# Patient Record
Sex: Female | Born: 1965 | Race: White | Hispanic: No | Smoking: Never smoker
Health system: Southern US, Community
[De-identification: ages and names within clinical notes are randomized; demographics above are authoritative.]

## PROBLEM LIST (undated history)

## (undated) DIAGNOSIS — N200 Calculus of kidney: Secondary | ICD-10-CM

## (undated) DIAGNOSIS — R002 Palpitations: Secondary | ICD-10-CM

## (undated) DIAGNOSIS — I10 Essential (primary) hypertension: Secondary | ICD-10-CM

## (undated) DIAGNOSIS — Q208 Other congenital malformations of cardiac chambers and connections: Secondary | ICD-10-CM

## (undated) DIAGNOSIS — E669 Obesity, unspecified: Secondary | ICD-10-CM

## (undated) DIAGNOSIS — R42 Dizziness and giddiness: Secondary | ICD-10-CM

## (undated) DIAGNOSIS — Q893 Situs inversus: Secondary | ICD-10-CM

## (undated) DIAGNOSIS — R011 Cardiac murmur, unspecified: Secondary | ICD-10-CM

## (undated) DIAGNOSIS — K589 Irritable bowel syndrome without diarrhea: Secondary | ICD-10-CM

## (undated) DIAGNOSIS — I071 Rheumatic tricuspid insufficiency: Secondary | ICD-10-CM

## (undated) HISTORY — DX: Other congenital malformations of cardiac chambers and connections: Q20.8

## (undated) HISTORY — DX: Irritable bowel syndrome without diarrhea: K58.9

## (undated) HISTORY — PX: CHOLECYSTECTOMY: SHX55

## (undated) HISTORY — DX: Palpitations: R00.2

## (undated) HISTORY — DX: Dizziness and giddiness: R42

## (undated) HISTORY — DX: Situs inversus: Q89.3

## (undated) HISTORY — PX: KIDNEY STONE SURGERY: SHX686

## (undated) HISTORY — DX: Calculus of kidney: N20.0

## (undated) HISTORY — DX: Obesity, unspecified: E66.9

## (undated) HISTORY — DX: Essential (primary) hypertension: I10

## (undated) HISTORY — DX: Rheumatic tricuspid insufficiency: I07.1

## (undated) HISTORY — DX: Cardiac murmur, unspecified: R01.1

---

## 1998-09-11 ENCOUNTER — Ambulatory Visit (HOSPITAL_COMMUNITY): Admission: RE | Admit: 1998-09-11 | Discharge: 1998-09-11 | Payer: Self-pay | Admitting: Family Medicine

## 2000-06-02 ENCOUNTER — Encounter: Payer: Self-pay | Admitting: Family Medicine

## 2000-06-02 ENCOUNTER — Ambulatory Visit (HOSPITAL_COMMUNITY): Admission: RE | Admit: 2000-06-02 | Discharge: 2000-06-02 | Payer: Self-pay | Admitting: Family Medicine

## 2000-07-29 ENCOUNTER — Encounter: Payer: Self-pay | Admitting: Family Medicine

## 2000-07-29 ENCOUNTER — Ambulatory Visit (HOSPITAL_COMMUNITY): Admission: RE | Admit: 2000-07-29 | Discharge: 2000-07-29 | Payer: Self-pay | Admitting: Family Medicine

## 2000-09-16 ENCOUNTER — Encounter: Payer: Self-pay | Admitting: Family Medicine

## 2000-09-16 ENCOUNTER — Ambulatory Visit (HOSPITAL_COMMUNITY): Admission: RE | Admit: 2000-09-16 | Discharge: 2000-09-16 | Payer: Self-pay | Admitting: Family Medicine

## 2000-11-09 ENCOUNTER — Encounter: Payer: Self-pay | Admitting: Emergency Medicine

## 2000-11-09 ENCOUNTER — Emergency Department (HOSPITAL_COMMUNITY): Admission: EM | Admit: 2000-11-09 | Discharge: 2000-11-09 | Payer: Self-pay | Admitting: Emergency Medicine

## 2000-12-04 ENCOUNTER — Emergency Department (HOSPITAL_COMMUNITY): Admission: EM | Admit: 2000-12-04 | Discharge: 2000-12-04 | Payer: Self-pay | Admitting: Emergency Medicine

## 2001-09-10 ENCOUNTER — Ambulatory Visit (HOSPITAL_COMMUNITY): Admission: RE | Admit: 2001-09-10 | Discharge: 2001-09-10 | Payer: Self-pay | Admitting: Family Medicine

## 2001-09-10 ENCOUNTER — Encounter: Payer: Self-pay | Admitting: Family Medicine

## 2001-11-21 ENCOUNTER — Encounter: Payer: Self-pay | Admitting: Emergency Medicine

## 2001-11-21 ENCOUNTER — Emergency Department (HOSPITAL_COMMUNITY): Admission: EM | Admit: 2001-11-21 | Discharge: 2001-11-21 | Payer: Self-pay | Admitting: Emergency Medicine

## 2003-05-28 ENCOUNTER — Encounter: Payer: Self-pay | Admitting: Emergency Medicine

## 2003-05-28 ENCOUNTER — Emergency Department (HOSPITAL_COMMUNITY): Admission: EM | Admit: 2003-05-28 | Discharge: 2003-05-28 | Payer: Self-pay | Admitting: Emergency Medicine

## 2003-09-24 ENCOUNTER — Encounter: Payer: Self-pay | Admitting: *Deleted

## 2003-09-24 ENCOUNTER — Emergency Department (HOSPITAL_COMMUNITY): Admission: EM | Admit: 2003-09-24 | Discharge: 2003-09-24 | Payer: Self-pay | Admitting: *Deleted

## 2004-01-05 ENCOUNTER — Other Ambulatory Visit: Admission: RE | Admit: 2004-01-05 | Discharge: 2004-01-05 | Payer: Self-pay | Admitting: Family Medicine

## 2004-02-09 ENCOUNTER — Encounter: Admission: RE | Admit: 2004-02-09 | Discharge: 2004-02-09 | Payer: Self-pay | Admitting: Gastroenterology

## 2004-09-17 ENCOUNTER — Ambulatory Visit (HOSPITAL_COMMUNITY): Admission: RE | Admit: 2004-09-17 | Discharge: 2004-09-17 | Payer: Self-pay | Admitting: Family Medicine

## 2005-02-10 ENCOUNTER — Other Ambulatory Visit: Admission: RE | Admit: 2005-02-10 | Discharge: 2005-02-10 | Payer: Self-pay | Admitting: Family Medicine

## 2005-11-10 ENCOUNTER — Emergency Department (HOSPITAL_COMMUNITY): Admission: EM | Admit: 2005-11-10 | Discharge: 2005-11-10 | Payer: Self-pay | Admitting: Emergency Medicine

## 2006-07-22 ENCOUNTER — Other Ambulatory Visit: Admission: RE | Admit: 2006-07-22 | Discharge: 2006-07-22 | Payer: Self-pay | Admitting: Family Medicine

## 2007-07-29 ENCOUNTER — Encounter: Admission: RE | Admit: 2007-07-29 | Discharge: 2007-07-29 | Payer: Self-pay | Admitting: Neurology

## 2007-08-05 ENCOUNTER — Other Ambulatory Visit: Admission: RE | Admit: 2007-08-05 | Discharge: 2007-08-05 | Payer: Self-pay | Admitting: Family Medicine

## 2007-10-14 ENCOUNTER — Encounter: Admission: RE | Admit: 2007-10-14 | Discharge: 2007-10-14 | Payer: Self-pay | Admitting: Family Medicine

## 2008-06-07 ENCOUNTER — Emergency Department (HOSPITAL_COMMUNITY): Admission: EM | Admit: 2008-06-07 | Discharge: 2008-06-07 | Payer: Self-pay | Admitting: Emergency Medicine

## 2008-10-16 ENCOUNTER — Encounter: Admission: RE | Admit: 2008-10-16 | Discharge: 2008-10-16 | Payer: Self-pay | Admitting: Family Medicine

## 2009-03-06 ENCOUNTER — Other Ambulatory Visit: Admission: RE | Admit: 2009-03-06 | Discharge: 2009-03-06 | Payer: Self-pay | Admitting: Family Medicine

## 2009-10-23 ENCOUNTER — Encounter: Admission: RE | Admit: 2009-10-23 | Discharge: 2009-10-23 | Payer: Self-pay | Admitting: Family Medicine

## 2010-10-14 ENCOUNTER — Other Ambulatory Visit: Admission: RE | Admit: 2010-10-14 | Discharge: 2010-10-14 | Payer: Self-pay | Admitting: Obstetrics and Gynecology

## 2010-10-29 ENCOUNTER — Encounter: Admission: RE | Admit: 2010-10-29 | Discharge: 2010-10-29 | Payer: Self-pay | Admitting: Family Medicine

## 2010-11-03 ENCOUNTER — Emergency Department (HOSPITAL_COMMUNITY): Admission: EM | Admit: 2010-11-03 | Discharge: 2010-11-03 | Payer: Self-pay | Admitting: Emergency Medicine

## 2011-03-11 LAB — WET PREP, GENITAL
Trich, Wet Prep: NONE SEEN
Yeast Wet Prep HPF POC: NONE SEEN

## 2011-03-11 LAB — URINALYSIS, ROUTINE W REFLEX MICROSCOPIC
Bilirubin Urine: NEGATIVE
Glucose, UA: NEGATIVE mg/dL
Hgb urine dipstick: NEGATIVE
Ketones, ur: NEGATIVE mg/dL
Nitrite: NEGATIVE
Protein, ur: NEGATIVE mg/dL
Specific Gravity, Urine: 1.014 (ref 1.005–1.030)
Urobilinogen, UA: 0.2 mg/dL (ref 0.0–1.0)
pH: 7.5 (ref 5.0–8.0)

## 2011-03-11 LAB — GC/CHLAMYDIA PROBE AMP, GENITAL
Chlamydia, DNA Probe: NEGATIVE
GC Probe Amp, Genital: NEGATIVE

## 2011-03-11 LAB — POCT PREGNANCY, URINE: Preg Test, Ur: NEGATIVE

## 2011-04-07 ENCOUNTER — Emergency Department (HOSPITAL_COMMUNITY)
Admission: EM | Admit: 2011-04-07 | Discharge: 2011-04-08 | Disposition: A | Discharge status: Home / Self Care | Payer: BC Managed Care – HMO | Attending: Emergency Medicine | Admitting: Emergency Medicine

## 2011-04-07 ENCOUNTER — Emergency Department (HOSPITAL_COMMUNITY): Payer: BC Managed Care – HMO

## 2011-04-07 DIAGNOSIS — I10 Essential (primary) hypertension: Secondary | ICD-10-CM | POA: Insufficient documentation

## 2011-04-07 DIAGNOSIS — M25519 Pain in unspecified shoulder: Secondary | ICD-10-CM | POA: Insufficient documentation

## 2011-04-07 DIAGNOSIS — Z79899 Other long term (current) drug therapy: Secondary | ICD-10-CM | POA: Insufficient documentation

## 2011-04-07 DIAGNOSIS — Z9889 Other specified postprocedural states: Secondary | ICD-10-CM | POA: Insufficient documentation

## 2011-04-07 DIAGNOSIS — M79609 Pain in unspecified limb: Secondary | ICD-10-CM | POA: Insufficient documentation

## 2011-04-21 IMAGING — US US PELVIS COMPLETE MODIFY
1 series · 14 of 25 positions shown · non-contrast
Comparison: None.

CLINICAL DATA: Right pelvic pain

TRANSABDOMINAL AND TRANSVAGINAL ULTRASOUND OF PELVIS
DOPPLER ULTRASOUND OF OVARIES
TECHNIQUE: Both transabdominal and transvaginal ultrasound
examinations of the pelvis were performed including evaluation of
the uterus, ovaries, adnexal regions, and pelvic cul-de-sac.
Transabdominal technique was performed for global imaging of the
pelvis.  Transvaginal technique was performed for detailed
evaluation of the endometrium and/or ovaries.  Color and duplex
Doppler ultrasound was utilized to evaluate blood flow to the
ovaries.

[Series 1: us pelvis complete modify · 0.26mm/px · 14 of 53 slices shown]
[im 1/53]
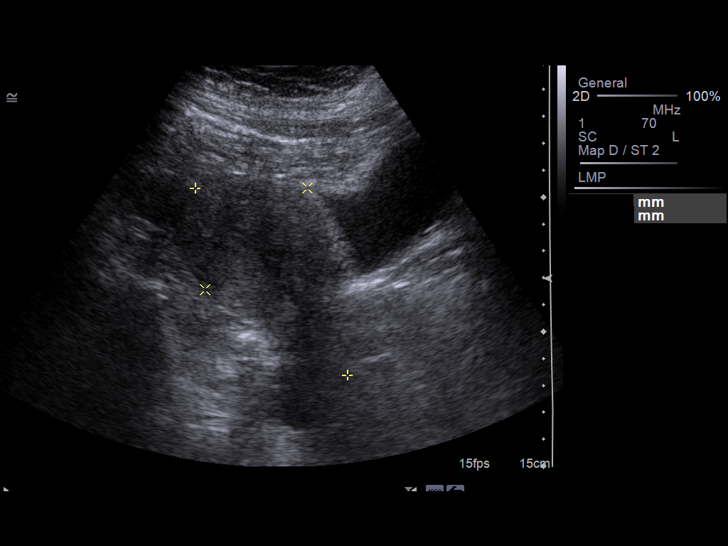
[im 5/53]
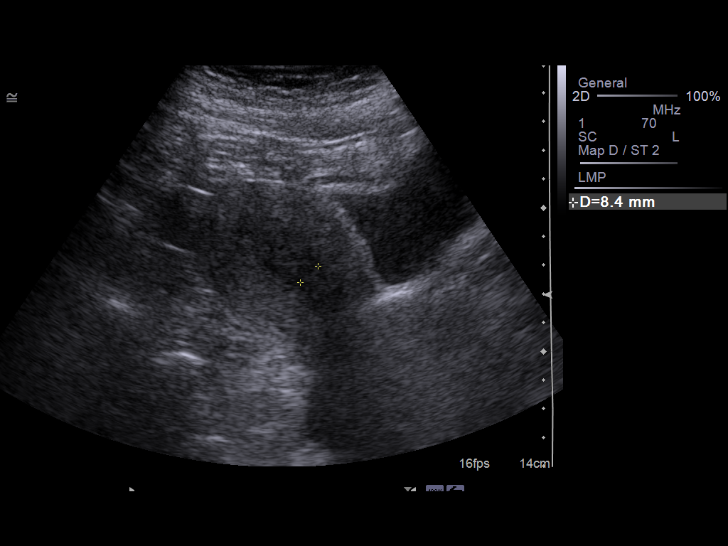
[im 9/53]
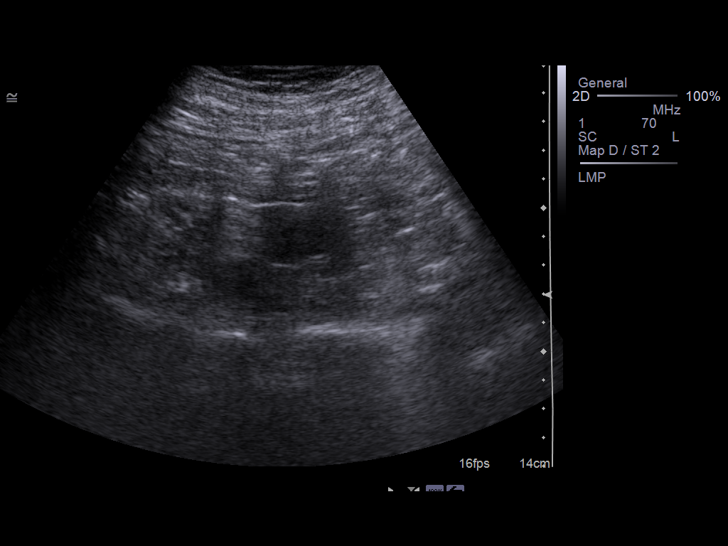
[im 14/53]
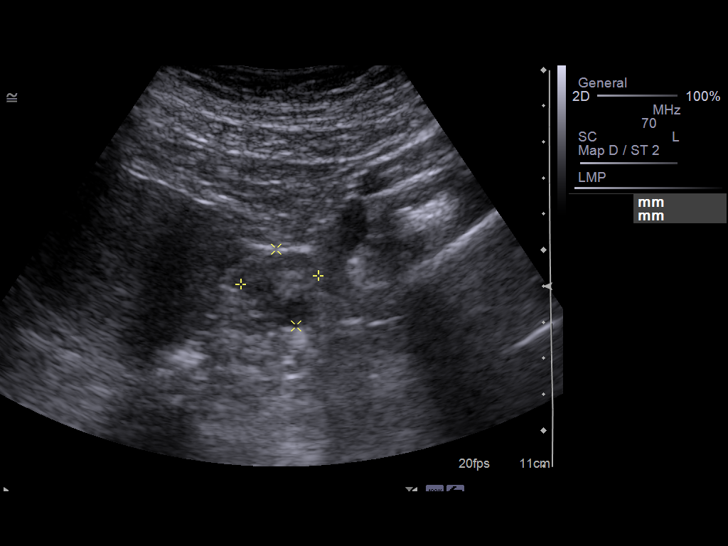
[im 18/53]
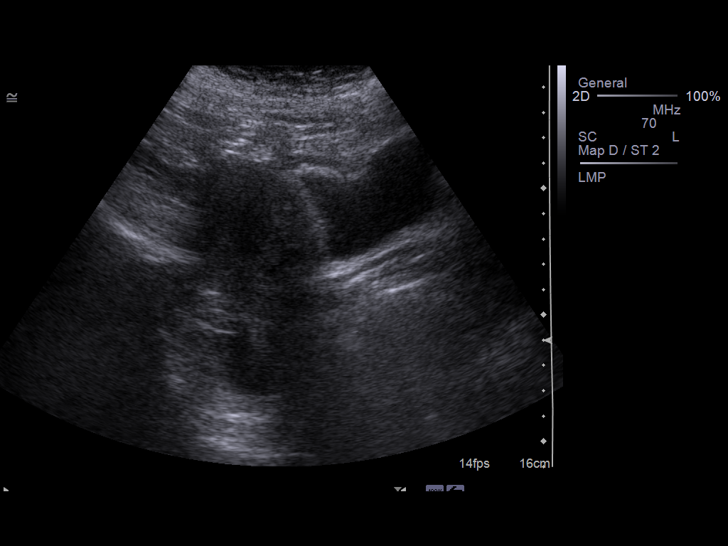
[im 20/53]
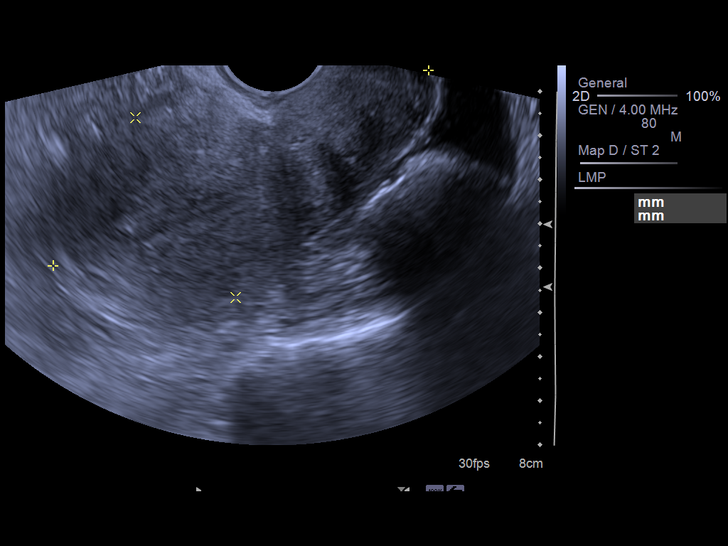
[im 24/53]
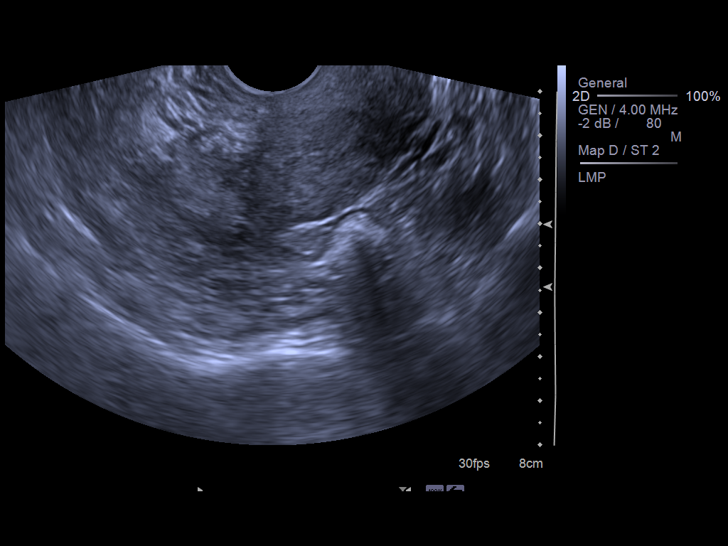
[im 29/53]
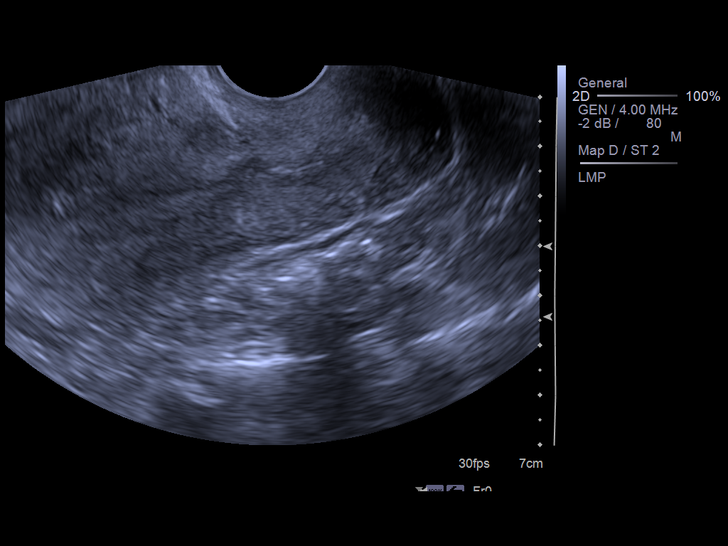
[im 33/53]
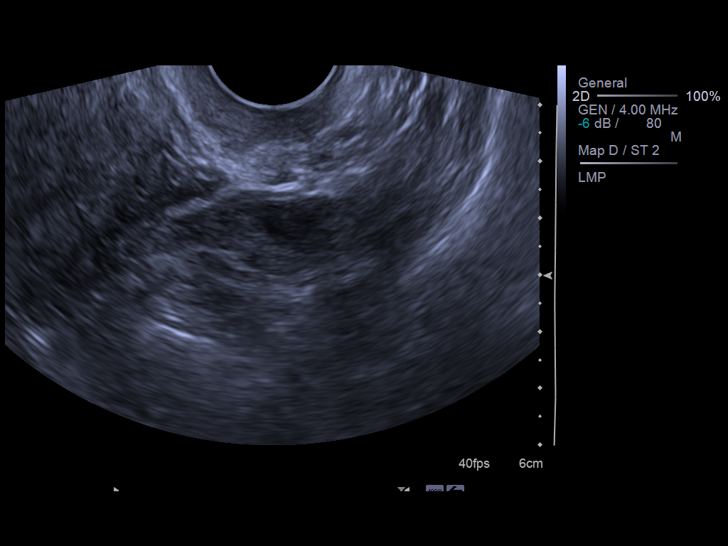
[im 35/53]
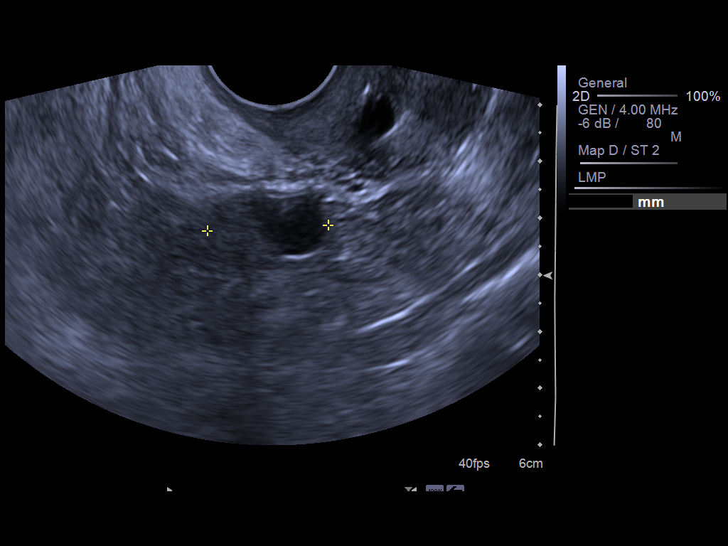
[im 40/53]
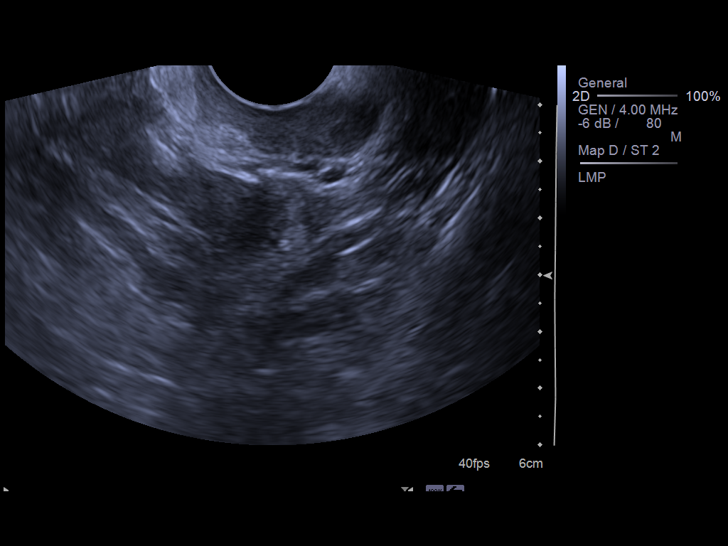
[im 44/53]
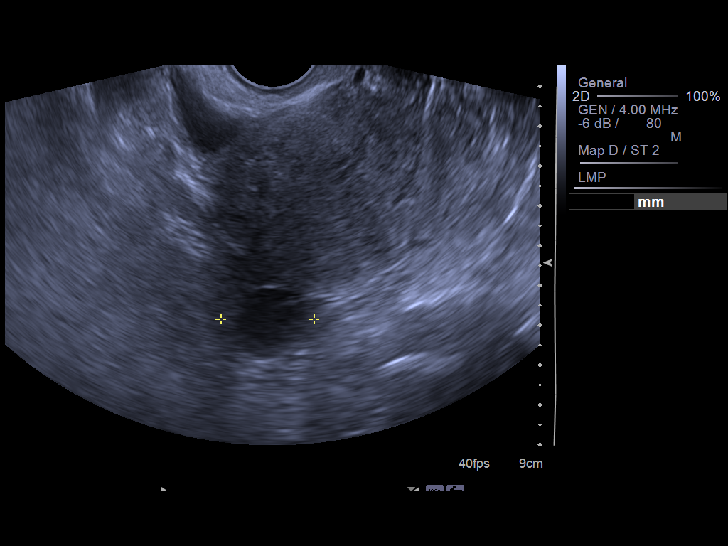
[im 48/53]
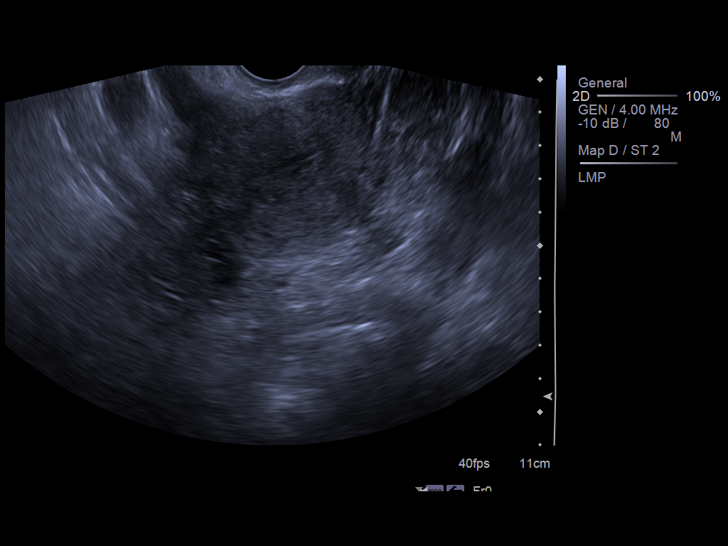
[im 53/53]
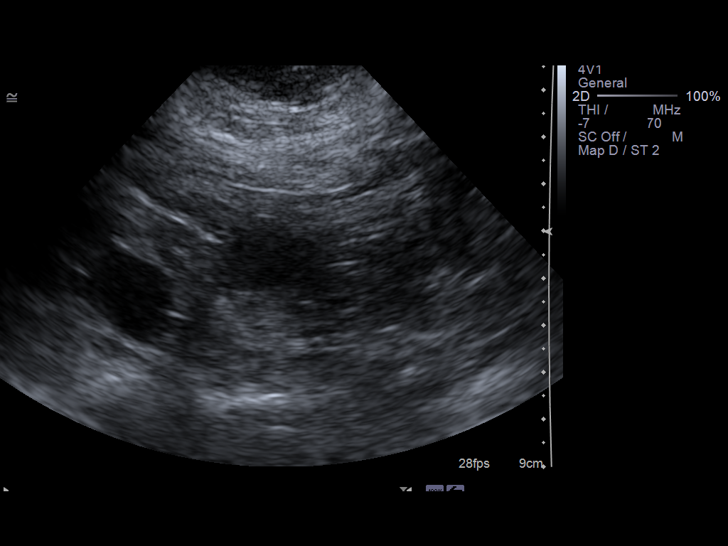

[14 of 25 positions shown; findings below may reference images not displayed]

FINDINGS: Uterus 9.0 x 5.4 x 6.3 cm.  Uniform myometrium.

Endometrium 5 mm in thickness and uniform.

Right Ovary 3.5 x 2.6 x 2.3 cm.  No mass.  Arterial and venous
Doppler flow is documented.

Left Ovary 2.4 x 1.5 x 2.1 cm and within normal limits.  Arterial
and venous Doppler flow is documented.

Other Findings:  Small amount of free fluid.
IMPRESSION: Within normal limits.  Arterial and venous Doppler flow is
documented in both ovaries.

## 2011-05-16 ENCOUNTER — Inpatient Hospital Stay (HOSPITAL_COMMUNITY)
Admission: AD | Admit: 2011-05-16 | Discharge: 2011-05-16 | Disposition: A | Discharge status: Home / Self Care | Payer: BC Managed Care – HMO | Source: Ambulatory Visit | Attending: Obstetrics and Gynecology | Admitting: Obstetrics and Gynecology

## 2011-05-16 DIAGNOSIS — N841 Polyp of cervix uteri: Secondary | ICD-10-CM

## 2011-05-16 DIAGNOSIS — N949 Unspecified condition associated with female genital organs and menstrual cycle: Secondary | ICD-10-CM | POA: Insufficient documentation

## 2011-05-16 DIAGNOSIS — A5901 Trichomonal vulvovaginitis: Secondary | ICD-10-CM

## 2011-05-16 LAB — WET PREP, GENITAL: Yeast Wet Prep HPF POC: NONE SEEN

## 2011-05-17 LAB — GC/CHLAMYDIA PROBE AMP, GENITAL: GC Probe Amp, Genital: NEGATIVE

## 2011-06-16 ENCOUNTER — Other Ambulatory Visit: Payer: Self-pay | Admitting: Obstetrics and Gynecology

## 2011-06-27 ENCOUNTER — Emergency Department: Payer: Self-pay | Admitting: Emergency Medicine

## 2011-10-09 ENCOUNTER — Other Ambulatory Visit: Payer: Self-pay | Admitting: Family Medicine

## 2011-10-09 DIAGNOSIS — Z1231 Encounter for screening mammogram for malignant neoplasm of breast: Secondary | ICD-10-CM

## 2011-10-31 ENCOUNTER — Ambulatory Visit
Admission: RE | Admit: 2011-10-31 | Discharge: 2011-10-31 | Disposition: A | Discharge status: Home / Self Care | Payer: BC Managed Care – HMO | Source: Ambulatory Visit | Attending: Family Medicine | Admitting: Family Medicine

## 2011-10-31 DIAGNOSIS — Z1231 Encounter for screening mammogram for malignant neoplasm of breast: Secondary | ICD-10-CM

## 2011-11-06 ENCOUNTER — Other Ambulatory Visit (HOSPITAL_COMMUNITY)
Admission: RE | Admit: 2011-11-06 | Discharge: 2011-11-06 | Disposition: A | Discharge status: Home / Self Care | Payer: BC Managed Care – HMO | Source: Ambulatory Visit | Attending: Obstetrics and Gynecology | Admitting: Obstetrics and Gynecology

## 2011-11-06 ENCOUNTER — Other Ambulatory Visit: Payer: Self-pay | Admitting: Obstetrics and Gynecology

## 2011-11-06 DIAGNOSIS — Z01419 Encounter for gynecological examination (general) (routine) without abnormal findings: Secondary | ICD-10-CM | POA: Insufficient documentation

## 2012-01-14 ENCOUNTER — Ambulatory Visit: Payer: Self-pay | Admitting: Urology

## 2012-05-23 ENCOUNTER — Other Ambulatory Visit: Payer: Self-pay | Admitting: *Deleted

## 2012-05-23 ENCOUNTER — Encounter: Payer: Self-pay | Admitting: *Deleted

## 2012-06-09 ENCOUNTER — Ambulatory Visit
Admission: RE | Admit: 2012-06-09 | Discharge: 2012-06-09 | Disposition: A | Discharge status: Home / Self Care | Payer: 59 | Source: Ambulatory Visit | Attending: Orthopaedic Surgery | Admitting: Orthopaedic Surgery

## 2012-06-09 ENCOUNTER — Other Ambulatory Visit: Payer: Self-pay | Admitting: Orthopaedic Surgery

## 2012-06-09 DIAGNOSIS — R609 Edema, unspecified: Secondary | ICD-10-CM

## 2012-06-09 DIAGNOSIS — M79605 Pain in left leg: Secondary | ICD-10-CM

## 2012-07-01 IMAGING — CR DG ABDOMEN 1V
1 series · 2 of 2 positions shown · non-contrast
Comparison: none

REASON FOR EXAM: nephrolithiasis
COMMENTS:

PROCEDURE:     DXR - DXR KIDNEY URETER BLADDER  - January 14, 2012 [DATE]
RESULT:     Surgical clips are seen in the left mid upper abdomen. No
nephrolithiasis is demonstrated. Intrauterine device is present. No ureteral
stones are appreciated. The bony structures are unremarkable.

[Series 1: ap · 0.17mm/px · 2 of 2 slices shown]
[im 1/2]
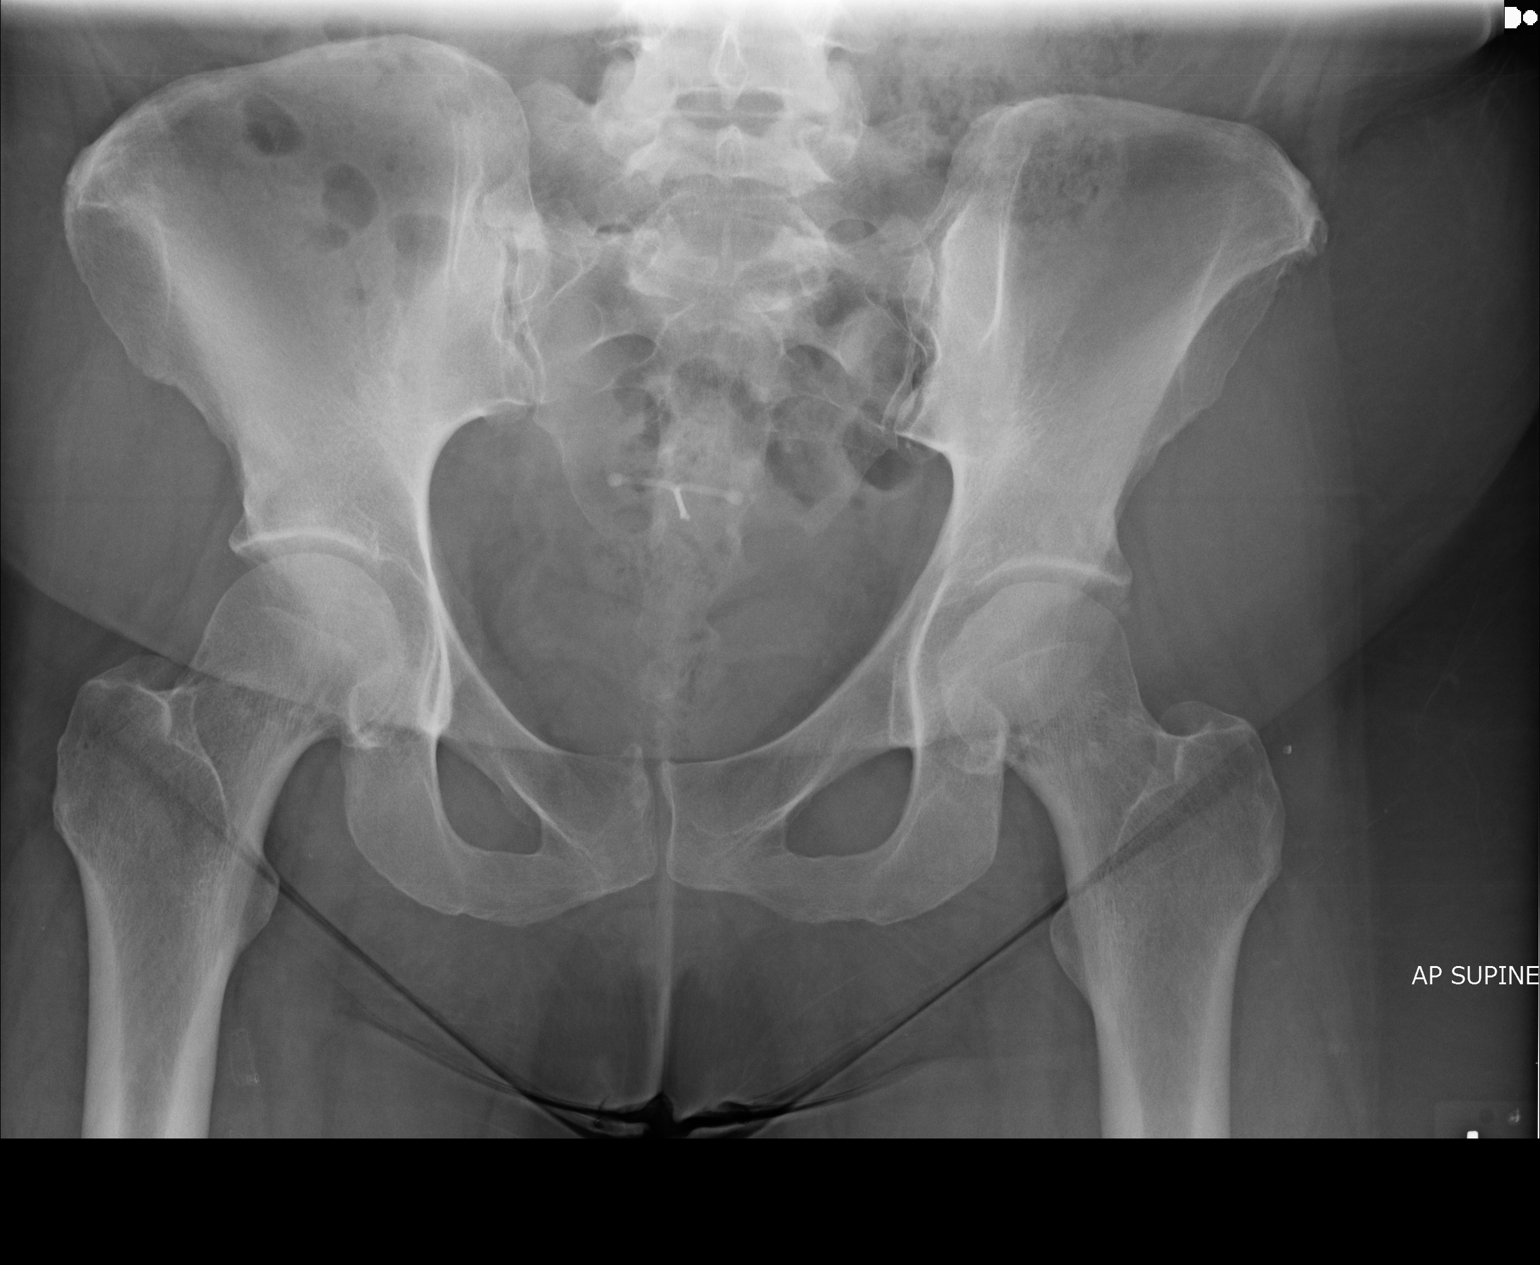
[im 2/2]
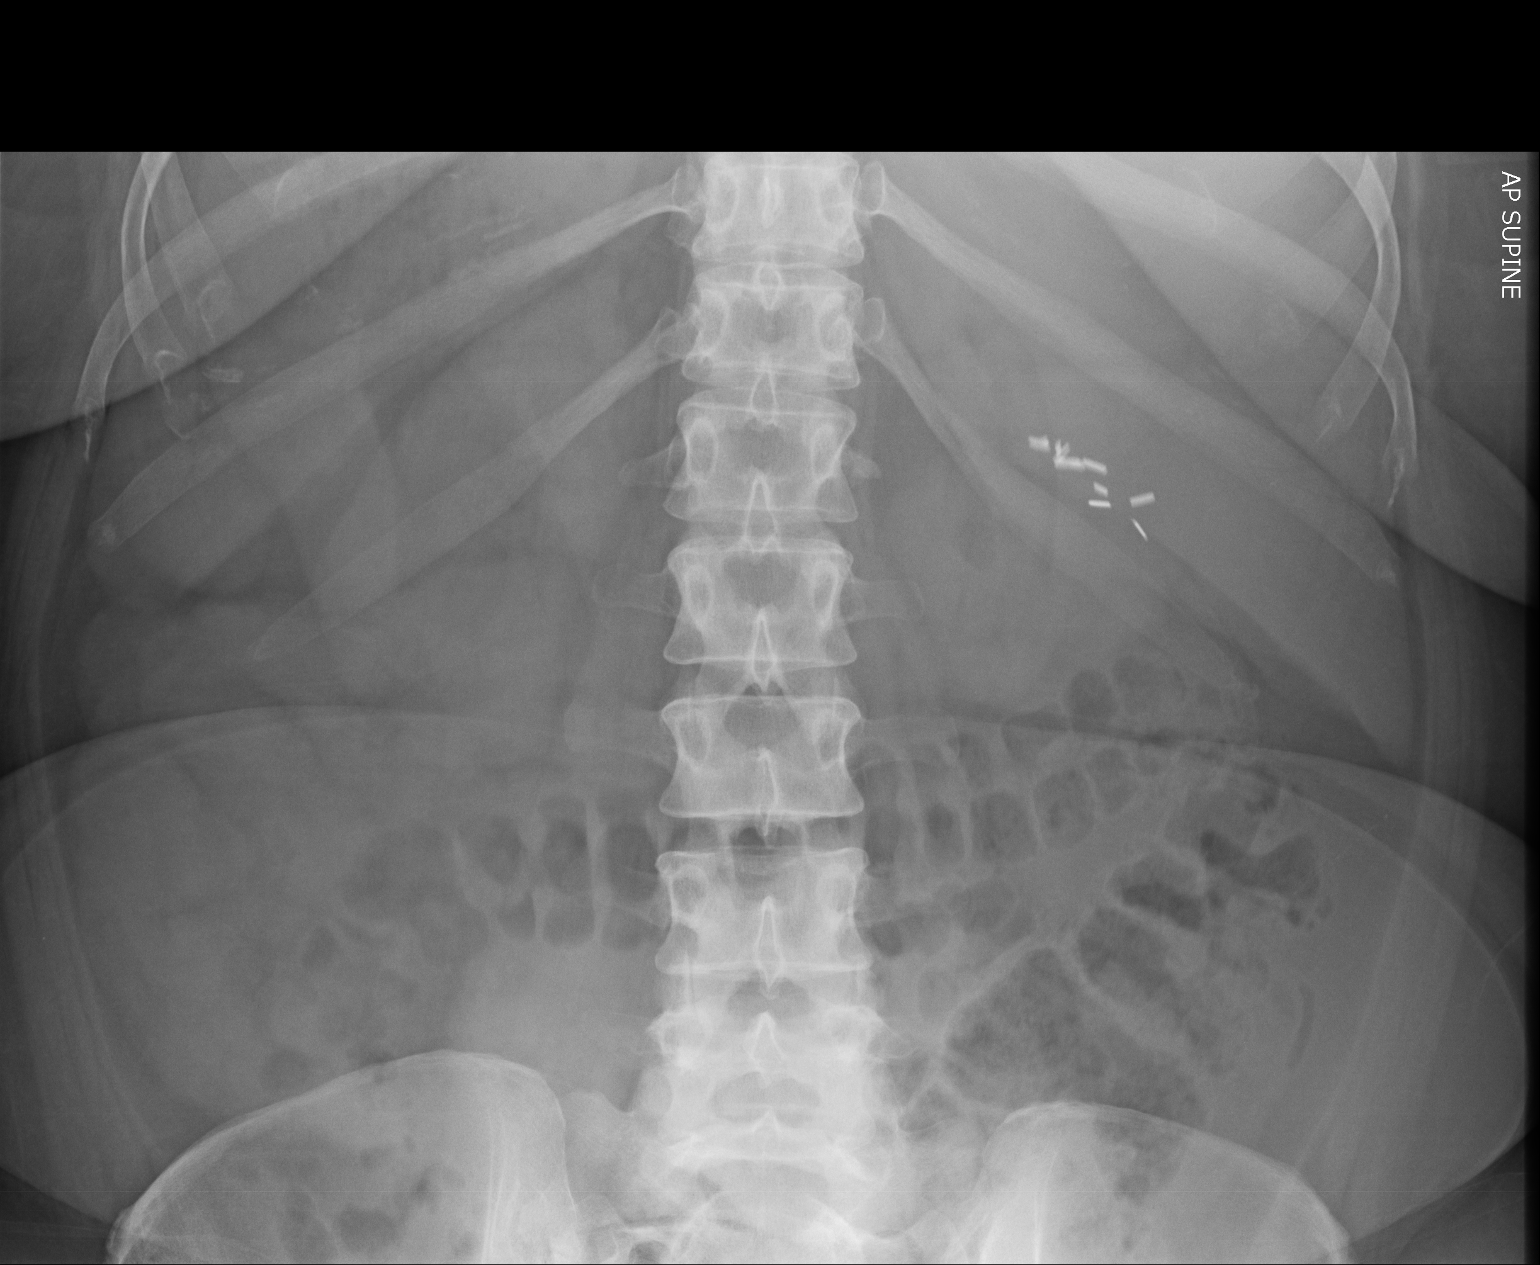

[2 of 2 positions shown; findings below may reference images not displayed]

IMPRESSION: No definite evidence of nephrolithiasis.

## 2012-10-08 ENCOUNTER — Other Ambulatory Visit: Payer: Self-pay | Admitting: Family Medicine

## 2012-10-08 DIAGNOSIS — Z1231 Encounter for screening mammogram for malignant neoplasm of breast: Secondary | ICD-10-CM

## 2012-11-04 ENCOUNTER — Ambulatory Visit
Admission: RE | Admit: 2012-11-04 | Discharge: 2012-11-04 | Disposition: A | Discharge status: Home / Self Care | Payer: 59 | Source: Ambulatory Visit | Attending: Family Medicine | Admitting: Family Medicine

## 2012-11-04 DIAGNOSIS — Z1231 Encounter for screening mammogram for malignant neoplasm of breast: Secondary | ICD-10-CM

## 2012-11-05 ENCOUNTER — Other Ambulatory Visit: Payer: Self-pay | Admitting: Obstetrics and Gynecology

## 2012-11-05 ENCOUNTER — Other Ambulatory Visit (HOSPITAL_COMMUNITY)
Admission: RE | Admit: 2012-11-05 | Discharge: 2012-11-05 | Disposition: A | Discharge status: Home / Self Care | Payer: 59 | Source: Ambulatory Visit | Attending: Obstetrics and Gynecology | Admitting: Obstetrics and Gynecology

## 2012-11-05 DIAGNOSIS — Z01419 Encounter for gynecological examination (general) (routine) without abnormal findings: Secondary | ICD-10-CM | POA: Insufficient documentation

## 2012-11-11 ENCOUNTER — Encounter: Payer: Self-pay | Admitting: Cardiology

## 2013-02-18 ENCOUNTER — Ambulatory Visit: Payer: Self-pay | Admitting: Urology

## 2014-01-02 ENCOUNTER — Other Ambulatory Visit: Payer: Self-pay

## 2014-01-02 DIAGNOSIS — Z1231 Encounter for screening mammogram for malignant neoplasm of breast: Secondary | ICD-10-CM

## 2014-01-20 ENCOUNTER — Emergency Department: Payer: Self-pay | Admitting: Emergency Medicine

## 2014-01-20 LAB — BASIC METABOLIC PANEL
Anion Gap: 4 — ABNORMAL LOW (ref 7–16)
BUN: 11 mg/dL (ref 7–18)
Calcium, Total: 9.2 mg/dL (ref 8.5–10.1)
Chloride: 103 mmol/L (ref 98–107)
Co2: 30 mmol/L (ref 21–32)
Creatinine: 0.59 mg/dL — ABNORMAL LOW (ref 0.60–1.30)
EGFR (African American): >60
EGFR (Non-African Amer.): >60
Glucose: 89 mg/dL (ref 65–99)
OSMOLALITY: 273 (ref 275–301)
POTASSIUM: 4 mmol/L (ref 3.5–5.1)
Sodium: 137 mmol/L (ref 136–145)

## 2014-01-20 LAB — CBC
HCT: 38.8 % (ref 35.0–47.0)
HGB: 13 g/dL (ref 12.0–16.0)
MCH: 30.6 pg (ref 26.0–34.0)
MCHC: 33.4 g/dL (ref 32.0–36.0)
MCV: 92 fL (ref 80–100)
PLATELETS: 246 10*3/uL (ref 150–440)
RBC: 4.23 10*6/uL (ref 3.80–5.20)
RDW: 13.3 % (ref 11.5–14.5)
WBC: 10.7 10*3/uL (ref 3.6–11.0)

## 2014-01-20 LAB — TROPONIN I

## 2014-01-23 ENCOUNTER — Ambulatory Visit: Payer: 59

## 2014-01-30 ENCOUNTER — Ambulatory Visit
Admission: RE | Admit: 2014-01-30 | Discharge: 2014-01-30 | Disposition: A | Discharge status: Home / Self Care | Payer: 59 | Source: Ambulatory Visit

## 2014-01-30 DIAGNOSIS — Z1231 Encounter for screening mammogram for malignant neoplasm of breast: Secondary | ICD-10-CM

## 2014-02-23 ENCOUNTER — Emergency Department: Payer: Self-pay | Admitting: Internal Medicine

## 2014-02-23 LAB — COMPREHENSIVE METABOLIC PANEL
ANION GAP: 7 (ref 7–16)
Albumin: 3.8 g/dL (ref 3.4–5.0)
Alkaline Phosphatase: 122 U/L — ABNORMAL HIGH
BILIRUBIN TOTAL: 1 mg/dL (ref 0.2–1.0)
BUN: 12 mg/dL (ref 7–18)
CHLORIDE: 105 mmol/L (ref 98–107)
CO2: 27 mmol/L (ref 21–32)
Calcium, Total: 8.9 mg/dL (ref 8.5–10.1)
Creatinine: 0.76 mg/dL (ref 0.60–1.30)
EGFR (African American): >60
EGFR (Non-African Amer.): >60
GLUCOSE: 124 mg/dL — AB (ref 65–99)
Osmolality: 279 (ref 275–301)
Potassium: 4 mmol/L (ref 3.5–5.1)
SGOT(AST): 25 U/L (ref 15–37)
SGPT (ALT): 37 U/L (ref 12–78)
Sodium: 139 mmol/L (ref 136–145)
TOTAL PROTEIN: 7.9 g/dL (ref 6.4–8.2)

## 2014-02-23 LAB — CBC WITH DIFFERENTIAL/PLATELET
Basophil #: 0 10*3/uL (ref 0.0–0.1)
Basophil %: 0.4 %
Eosinophil #: 0 10*3/uL (ref 0.0–0.7)
Eosinophil %: 0.2 %
HCT: 42.9 % (ref 35.0–47.0)
HGB: 15.1 g/dL (ref 12.0–16.0)
LYMPHS ABS: 0.3 10*3/uL — AB (ref 1.0–3.6)
LYMPHS PCT: 3.3 %
MCH: 32.2 pg (ref 26.0–34.0)
MCHC: 35.1 g/dL (ref 32.0–36.0)
MCV: 92 fL (ref 80–100)
MONOS PCT: 3 %
Monocyte #: 0.3 x10 3/mm (ref 0.2–0.9)
Neutrophil #: 9.7 10*3/uL — ABNORMAL HIGH (ref 1.4–6.5)
Neutrophil %: 93.1 %
PLATELETS: 216 10*3/uL (ref 150–440)
RBC: 4.68 10*6/uL (ref 3.80–5.20)
RDW: 13 % (ref 11.5–14.5)
WBC: 10.4 10*3/uL (ref 3.6–11.0)

## 2014-02-23 LAB — URINALYSIS, COMPLETE
BACTERIA: NONE SEEN
BLOOD: NEGATIVE
Bilirubin,UR: NEGATIVE
Glucose,UR: NEGATIVE mg/dL (ref 0–75)
NITRITE: NEGATIVE
Ph: 5 (ref 4.5–8.0)
Protein: NEGATIVE
RBC,UR: 2 /HPF (ref 0–5)
SPECIFIC GRAVITY: 1.02 (ref 1.003–1.030)
Squamous Epithelial: 4

## 2014-03-20 ENCOUNTER — Encounter: Payer: Self-pay | Admitting: Cardiovascular Disease

## 2014-03-20 ENCOUNTER — Ambulatory Visit (INDEPENDENT_AMBULATORY_CARE_PROVIDER_SITE_OTHER): Payer: 59 | Admitting: Cardiovascular Disease

## 2014-03-20 ENCOUNTER — Encounter (INDEPENDENT_AMBULATORY_CARE_PROVIDER_SITE_OTHER): Payer: Self-pay

## 2014-03-20 VITALS — BP 155/81 | HR 69 | Ht 69.0 in | Wt 290.0 lb

## 2014-03-20 DIAGNOSIS — R011 Cardiac murmur, unspecified: Secondary | ICD-10-CM | POA: Insufficient documentation

## 2014-03-20 DIAGNOSIS — R079 Chest pain, unspecified: Secondary | ICD-10-CM

## 2014-03-20 DIAGNOSIS — R000 Tachycardia, unspecified: Secondary | ICD-10-CM

## 2014-03-20 NOTE — Progress Notes (Signed)
HPI  This is a pleasant 48 year old female who is here today to establish cardiovascular care. 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 She has not seen a cardiologist in more than 5 years. She saw Dr. [place] in the past in [place]. She moved recently to [facility] and will be establishing with primary care here. She denies any chest pain or dyspnea. She has prolonged history of intermittent palpitations with no documented arrhythmia. These episodes are usually not frequent.  She denies smoking. She uses alcohol occasionally and has no family history of heart disease. 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  Allergies  Allergen Reactions  . Azithromycin      Current Outpatient Prescriptions on File Prior to Visit  Medication Sig Dispense Refill  . DiphenhydrAMINE HCl, Sleep, ([facility] SLEEP AID NIGHTTIME PO) Take 1 tablet by mouth as needed.       No current facility-administered medications on file prior to visit.     Past Medical History  Diagnosis Date  . Tricuspid insufficiency     mild  . Cardiac murmur   . Situs inversus with dextrocardia   . IBS (irritable bowel syndrome)   . Obesity   . Heart palpitations   . Dizziness   . Chest pain   . Hypertension   . Kidney stone   . 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 Cholecystectomy    . Kidney stone surgery      x3  . Cesarean section       Family History  Problem Relation Age of Onset  . Hypertension Mother   . Stroke Mother   . Diabetes Mother   . Kidney failure Mother   . Hyperlipidemia Mother   . Hypertension Brother   . Hyperlipidemia Father   . Hypertension Father      History   Social History  . Marital Status: Divorced    Spouse Name: N/A     Number of Children: N/A  . Years of Education: N/A   Occupational History  . Not on file.   Social History Main Topics  . Smoking status: Never Smoker   . Smokeless tobacco: Never Used  . Alcohol Use: Yes     Comment: occassional  . Drug Use: No  . Sexual Activity: Not on file   Other Topics Concern  . Not on file   Social History Narrative  . No narrative on file     ROS A 10 point review of system was performed. It is negative other than that mentioned in the history of present illness.   PHYSICAL EXAM   BP 155/81  Pulse 69  Ht 5\' 9"  (1.753 m)  Wt 290 lb (131.543 kg)  BMI 42.81 kg/m2 Constitutional: She is oriented to person, place, and time. She appears well-developed and well-nourished. No distress.  HENT: No nasal discharge.  Head: Normocephalic and atraumatic.  Eyes: Pupils are equal and round. No discharge.  Neck: Normal range of motion. Neck supple. No JVD present. No thyromegaly present.  Cardiovascular: Normal rate, regular rhythm, normal heart sounds. Exam reveals no gallop and no friction rub. There is a 1/6 systolic ejection murmur in the aortic area. The heart Apex is on the right side Pulmonary/Chest: Effort normal and breath sounds normal. No  stridor. No respiratory distress. She has no wheezes. She has no rales. She exhibits no tenderness.  Abdominal: Soft. Bowel sounds are normal. She exhibits no distension. There is no tenderness. There is no rebound and no guarding.  Musculoskeletal: Normal range of motion. She exhibits no edema and no tenderness.  Neurological: She is alert and oriented to person, place, and time. Coordination normal.  Skin: Skin is warm and dry. No rash noted. She is not diaphoretic. No erythema. No pallor.  Psychiatric: She has a normal mood and affect. Her behavior is normal. Judgment and thought content normal.     EKG: Normal sinus rhythm with manifestations of dextrocardia.   ASSESSMENT AND PLAN

## 2014-03-20 NOTE — Assessment & Plan Note (Signed)
The patient has a heart murmur suggestive of an aortic etiology. I wonder if she has bicuspid aortic valve. No recent echocardiogram. I requested an echocardiogram for evaluation. She does have mild palpitations but these are not frequent enough to be captured on a Holter monitor. Further monitoring can be considered if symptoms become more frequent.

## 2014-03-20 NOTE — Patient Instructions (Signed)
Your physician has requested that you have an echocardiogram. Echocardiography is a painless test that uses sound waves to create images of your heart. It provides your doctor with information about the size and shape of your heart and how well your heart's chambers and valves are working. This procedure takes approximately one hour. There are no restrictions for this procedure.  Your physician wants you to follow-up in: 12 months You will receive a reminder letter in the mail two months in advance. If you don't receive a letter, please call our office to schedule the follow-up appointment.  

## 2014-03-27 ENCOUNTER — Other Ambulatory Visit (INDEPENDENT_AMBULATORY_CARE_PROVIDER_SITE_OTHER): Payer: 59

## 2014-03-27 ENCOUNTER — Other Ambulatory Visit: Payer: Self-pay

## 2014-03-27 DIAGNOSIS — R079 Chest pain, unspecified: Secondary | ICD-10-CM

## 2014-03-27 DIAGNOSIS — R011 Cardiac murmur, unspecified: Secondary | ICD-10-CM

## 2014-03-27 DIAGNOSIS — Q893 Situs inversus: Secondary | ICD-10-CM

## 2014-03-30 ENCOUNTER — Ambulatory Visit: Payer: Self-pay | Admitting: Urology

## 2014-03-30 ENCOUNTER — Telehealth: Payer: Self-pay | Admitting: *Deleted

## 2014-03-30 NOTE — Telephone Encounter (Signed)
Patient wanting her results of her echo

## 2014-03-30 NOTE — Telephone Encounter (Signed)
LVM informing patient that we will call next week with echo results

## 2014-11-03 ENCOUNTER — Ambulatory Visit: Payer: Self-pay | Admitting: Podiatry

## 2014-11-10 ENCOUNTER — Encounter: Payer: Self-pay | Admitting: Podiatry

## 2014-11-10 ENCOUNTER — Ambulatory Visit (INDEPENDENT_AMBULATORY_CARE_PROVIDER_SITE_OTHER): Payer: 59 | Admitting: Podiatry

## 2014-11-10 VITALS — BP 145/83 | HR 70 | Resp 16 | Ht 69.0 in | Wt 286.0 lb

## 2014-11-10 DIAGNOSIS — B351 Tinea unguium: Secondary | ICD-10-CM

## 2014-11-10 DIAGNOSIS — Z79899 Other long term (current) drug therapy: Secondary | ICD-10-CM

## 2014-11-10 MED ORDER — TERBINAFINE HCL 250 MG PO TABS: 250.0000 mg | ORAL_TABLET | Freq: Every day | ORAL | Status: AC

## 2014-11-10 NOTE — Progress Notes (Signed)
   Subjective:    Patient ID: Katie Bruce, female    DOB: 1966/08/28, 48 y.o.   MRN: [id number]  HPI Comments: Both of my big toenails have fungus. Ive had it for it least one year. They have remained the same. My toenails do not hurt. i did have pedicures in the past but now i trim my toenails.     Review of Systems  Constitutional: Positive for unexpected weight change.  Gastrointestinal: Positive for diarrhea.  Skin:       Change in nails  Neurological: Positive for headaches.  Psychiatric/Behavioral: The patient is nervous/anxious.   All other systems reviewed and are negative.      Objective:   Physical Exam        Assessment & Plan:

## 2014-11-11 LAB — CBC WITH DIFFERENTIAL/PLATELET
BASOS ABS: 0 10*3/uL (ref 0.0–0.2)
Basos: 0 %
EOS ABS: 0.1 10*3/uL (ref 0.0–0.4)
Eos: 1 %
HEMATOCRIT: 37.4 % (ref 34.0–46.6)
Hemoglobin: 12.6 g/dL (ref 11.1–15.9)
IMMATURE GRANULOCYTES: 0 %
Immature Grans (Abs): 0 10*3/uL (ref 0.0–0.1)
LYMPHS ABS: 2.1 10*3/uL (ref 0.7–3.1)
Lymphs: 28 %
MCH: 30.4 pg (ref 26.6–33.0)
MCHC: 33.7 g/dL (ref 31.5–35.7)
MCV: 90 fL (ref 79–97)
MONOCYTES: 5 %
MONOS ABS: 0.4 10*3/uL (ref 0.1–0.9)
Neutrophils Absolute: 4.8 10*3/uL (ref 1.4–7.0)
Neutrophils Relative %: 66 %
RBC: 4.14 x10E6/uL (ref 3.77–5.28)
RDW: 13.6 % (ref 12.3–15.4)
WBC: 7.4 10*3/uL (ref 3.4–10.8)

## 2014-11-11 LAB — HEPATIC FUNCTION PANEL
ALBUMIN: 4.3 g/dL (ref 3.5–5.5)
ALT: 22 IU/L (ref 0–32)
AST: 20 IU/L (ref 0–40)
Alkaline Phosphatase: 109 IU/L (ref 39–117)
Bilirubin, Direct: 0.2 mg/dL (ref 0.00–0.40)
Total Bilirubin: 0.6 mg/dL (ref 0.0–1.2)
Total Protein: 6.7 g/dL (ref 6.0–8.5)

## 2014-11-12 NOTE — Progress Notes (Signed)
Subjective:     Patient ID: Earlie CountsLoretta M Mattera, female   DOB: 25-Aug-1966, 48 y.o.   MRN: [id number]  HPIpatient presents stating I have yellow discoloration on both my big toenails with the left being worse than the right. Do not remember specific injury but they did occur around the same time   Review of Systems  All other systems reviewed and are negative.      Objective:   Physical Exam  Constitutional: She is oriented to person, place, and time.  Cardiovascular: Intact distal pulses.   Musculoskeletal: Normal range of motion.  Neurological: She is oriented to person, place, and time.  Skin: Skin is warm.  Nursing note and vitals reviewed. neurovascular status found to be intact with muscle strength adequate and range of motion subtalar and midtarsal joint within normal limits. 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 I discussed with her the causes of this problem and the considerations for different treatment options and the fact that trauma may be a big part of this problem and that no matter what we do symptoms may persist. Patient understands this and wants treatment and today I did place her on Lamisil 250 mg daily for 90 days with instructions on usage and also topical medication. Patient will be seen back for us to recheck again in 4 months

## 2014-11-13 DIAGNOSIS — B351 Tinea unguium: Secondary | ICD-10-CM

## 2015-04-10 ENCOUNTER — Telehealth: Payer: Self-pay | Admitting: *Deleted

## 2015-04-10 NOTE — Telephone Encounter (Signed)
Pt states Dr. Charlsie Merlesegal has treated her for several months for a fungal infection and now the affected toenail has fallen off, does she need an appt.

## 2015-04-11 NOTE — Telephone Encounter (Signed)
Called patient-L/M-no appt needed at this time. Let the toenail grow back, since she is already being treated for the fungus, it may grow back normally.

## 2015-05-10 ENCOUNTER — Other Ambulatory Visit: Payer: Self-pay

## 2015-05-10 DIAGNOSIS — Z1231 Encounter for screening mammogram for malignant neoplasm of breast: Secondary | ICD-10-CM

## 2015-05-16 ENCOUNTER — Ambulatory Visit: Payer: Self-pay

## 2015-05-30 ENCOUNTER — Ambulatory Visit: Payer: Self-pay

## 2015-08-03 ENCOUNTER — Other Ambulatory Visit: Payer: Self-pay | Admitting: Neurology

## 2015-08-03 ENCOUNTER — Ambulatory Visit
Admission: RE | Admit: 2015-08-03 | Discharge: 2015-08-03 | Disposition: A | Discharge status: Home / Self Care | Payer: 59 | Source: Ambulatory Visit | Attending: Neurology | Admitting: Neurology

## 2015-08-03 DIAGNOSIS — M542 Cervicalgia: Secondary | ICD-10-CM

## 2020-07-13 ENCOUNTER — Other Ambulatory Visit: Payer: Self-pay | Admitting: Internal Medicine

## 2020-07-13 DIAGNOSIS — Z1231 Encounter for screening mammogram for malignant neoplasm of breast: Secondary | ICD-10-CM

## 2020-07-31 ENCOUNTER — Ambulatory Visit: Payer: 59

## 2020-08-10 ENCOUNTER — Other Ambulatory Visit: Payer: Self-pay

## 2020-08-10 ENCOUNTER — Ambulatory Visit
Admission: EM | Admit: 2020-08-10 | Discharge: 2020-08-10 | Disposition: A | Discharge status: Home / Self Care | Payer: 59

## 2020-08-10 DIAGNOSIS — M25562 Pain in left knee: Secondary | ICD-10-CM

## 2020-08-10 DIAGNOSIS — M25561 Pain in right knee: Secondary | ICD-10-CM | POA: Diagnosis not present

## 2020-08-10 DIAGNOSIS — W19XXXA Unspecified fall, initial encounter: Secondary | ICD-10-CM

## 2020-08-10 DIAGNOSIS — M25512 Pain in left shoulder: Secondary | ICD-10-CM

## 2020-08-10 MED ORDER — NAPROXEN 500 MG PO TABS: 500.0000 mg | ORAL_TABLET | Freq: Two times a day (BID) | ORAL | 0 refills | Status: AC

## 2020-08-10 MED ORDER — CYCLOBENZAPRINE HCL 5 MG PO TABS: 5.0000 mg | ORAL_TABLET | Freq: Two times a day (BID) | ORAL | 0 refills | Status: AC | PRN

## 2020-08-10 NOTE — ED Triage Notes (Signed)
Slip and fall at grocery store on Sunday. Tylenol and ibuprofen not helping. Pt c/o mid and low back pain, L sided neck pain and R shoulder pain

## 2020-08-10 NOTE — Discharge Instructions (Addendum)

## 2020-08-10 NOTE — ED Provider Notes (Signed)
[facility]    CSN: [id number] Arrival date & time: 08/10/20  0935      History   Chief Complaint Chief Complaint  Patient presents with  . Fall    HPI Katie Bruce is a 54 y.o. female presenting for evaluation of left shoulder pain, bilateral knee pain s/p fall.  Patient also noting thoracic back pain.  No chest pain, palpitations, difficulty breathing.  Patient states this happened at a grocery store on Sunday when she slipped on a wet floor.  No head trauma, LOC.  No numbness, weakness, severe abdominal pain, saddle anesthesia, urinary retention or fecal incontinence.  Has not taken Tylenol and ibuprofen with some relief.  Patient endorsing muscle tightness/stiffness.  Bruising has improved on left knee.  Able to ambulate independently.   Past Medical History:  Diagnosis Date  . Cardiac murmur   . 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 Chest pain   . Dizziness   . Heart palpitations   . Hypertension   . IBS (irritable bowel syndrome)   . Kidney stone   . Obesity   . Situs inversus with dextrocardia   . Tricuspid insufficiency    mild    Patient Active Problem List   Diagnosis Date Noted  . Murmur, cardiac 03/20/2014    Past Surgical History:  Procedure Laterality Date  . CESAREAN SECTION    . CHOLECYSTECTOMY    . KIDNEY STONE SURGERY     x3    OB History   No obstetric history on file.      Home Medications    Prior to Admission medications   Medication Sig Start Date End Date Taking? Authorizing Provider  colesevelam (WELCHOL) 625 MG tablet TAKE 3 TABLETS BY MOUTH TWO TIMES DAILY WITH MEALS 12/05/19  Yes [provider]  cyclobenzaprine (FLEXERIL) 5 MG tablet Take 1 tablet (5 mg total) by mouth 2 (two) times daily as needed for up to 7 days for muscle spasms. 08/10/20 08/17/20  Hall-Potvin, [place], PA-C  DiphenhydrAMINE HCl, Sleep, ([facility]  SLEEP AID NIGHTTIME PO) Take 1 tablet by mouth as needed.    [provider]  lisinopril (ZESTRIL) 10 MG tablet Take 10 mg by mouth daily. 07/22/20   [provider]  metoprolol succinate (TOPROL-XL) 50 MG 24 hr tablet Take 50 mg by mouth daily. Take with or immediately following a meal.    [provider]  naproxen (NAPROSYN) 500 MG tablet Take 1 tablet (500 mg total) by mouth 2 (two) times daily. 08/10/20   Hall-Potvin, [place], PA-C  Probiotic Product (PROBIOTIC DAILY PO) Take by mouth daily.    [provider]  rizatriptan (MAXALT) 10 MG tablet Take 10 mg by mouth as needed for migraine. May repeat in 2 hours if needed    [provider]  terbinafine (LAMISIL) 250 MG tablet Take 1 tablet (250 mg total) by mouth daily. 11/10/14   Lenn Sink, DPM    Family History Family History  Problem Relation Age of Onset  . Hypertension Mother   . Stroke Mother   . Diabetes Mother   . Kidney failure Mother   . Hyperlipidemia Mother   . Hyperlipidemia Father   . Hypertension Father   . Hypertension Brother     Social History Social History   Tobacco Use  . Smoking status: Never Smoker  . Smokeless tobacco: Never Used  Substance Use Topics  . Alcohol  use: Yes    Alcohol/week: 0.0 standard drinks    Comment: occassional  . Drug use: No     Allergies   Azithromycin and Sulfa antibiotics   Review of Systems As per HPI   Physical Exam Triage Vital Signs ED Triage Vitals  Enc Vitals Group     BP      Pulse      Resp      Temp      Temp src      SpO2      Weight      Height      Head Circumference      Peak Flow      Pain Score      Pain Loc      Pain Edu?      Excl. in GC?    No data found.  Updated Vital Signs BP (!) 151/86   Pulse 77   Temp (!) 97 F (36.1 C)   Resp 16   LMP  (LMP Unknown)   SpO2 96%   Visual Acuity Right Eye Distance:   Left Eye Distance:   Bilateral Distance:    Right Eye Near:   Left  Eye Near:    Bilateral Near:     Physical Exam Constitutional:      General: She is not in acute distress. HENT:     Head: Normocephalic and atraumatic.  Eyes:     General: No scleral icterus.    Pupils: Pupils are equal, round, and reactive to light.  Cardiovascular:     Rate and Rhythm: Normal rate and regular rhythm.  Pulmonary:     Effort: Pulmonary effort is normal. No respiratory distress.     Breath sounds: No wheezing.  Musculoskeletal:        General: Tenderness present. No swelling. Normal range of motion.     Comments: Patient with diffuse, mild tenderness of left shoulder, bilateral trapezius, bilateral knees.  No bony tenderness, crepitus, deformity.  Knee joint stable bilaterally without effusion.  Full active ROM of upper and lower extremities bilaterally with 5/5 strength.  NVI  Skin:    Capillary Refill: Capillary refill takes less than 2 seconds.     Coloration: Skin is not jaundiced or pale.  Neurological:     General: No focal deficit present.     Mental Status: She is alert and oriented to person, place, and time.      UC Treatments / Results  Labs (all labs ordered are listed, but only abnormal results are displayed) Labs Reviewed - No data to display  EKG   Radiology No results found.  Procedures Procedures (including critical care time)  Medications Ordered in UC Medications - No data to display  Initial Impression / Assessment and Plan / UC Course  I have reviewed the triage vital signs and the nursing notes.  Pertinent labs & imaging results that were available during my care of the patient were reviewed by me and considered in my medical decision making (see chart for details).     Exam reassuring at this time: Low concern for fracture.  Patient agrees to deferring radiography at this time.  Will treat supportively as outlined below. Final Clinical Impressions(s) / UC Diagnoses   Final diagnoses:  Fall, initial encounter  Acute pain  of left shoulder  Acute bilateral knee pain     Discharge Instructions     Heat therapy (hot compress, warm wash rag, hot showers, etc.) can help  relax muscles and soothe muscle aches. Cold therapy (ice packs) can be used to help swelling both after injury and after prolonged use of areas of chronic pain/aches.  Pain medication:  500 mg Naprosyn/Aleve (naproxen) every 12 hours with food:  AVOID other NSAIDs while taking this (may have Tylenol).  May take muscle relaxer as needed for severe pain / spasm.  (This medication may cause you to become tired so it is important you do not drink alcohol or operate heavy machinery while on this medication.  Recommend your first dose to be taken before bedtime to monitor for side effects safely)  Important to follow up with specialist(s) below for further evaluation/management if your symptoms persist or worsen.    ED Prescriptions    Medication Sig Dispense Auth. Provider   naproxen (NAPROSYN) 500 MG tablet Take 1 tablet (500 mg total) by mouth 2 (two) times daily. 30 tablet Hall-Potvin, [place], PA-C   cyclobenzaprine (FLEXERIL) 5 MG tablet Take 1 tablet (5 mg total) by mouth 2 (two) times daily as needed for up to 7 days for muscle spasms. 14 tablet Hall-Potvin, [place], PA-C     I have reviewed the PDMP during this encounter.   Hall-Potvin, [place], [place] 08/10/20 1057
